# Patient Record
Sex: Female | Born: 2000 | Race: White | Hispanic: No | Marital: Single | Smoking: Former smoker
Health system: Southern US, Community
[De-identification: ages and names within clinical notes are randomized; demographics above are authoritative.]

## PROBLEM LIST (undated history)

## (undated) DIAGNOSIS — F909 Attention-deficit hyperactivity disorder, unspecified type: Secondary | ICD-10-CM

## (undated) DIAGNOSIS — G4700 Insomnia, unspecified: Secondary | ICD-10-CM

## (undated) DIAGNOSIS — N946 Dysmenorrhea, unspecified: Secondary | ICD-10-CM

## (undated) DIAGNOSIS — F419 Anxiety disorder, unspecified: Secondary | ICD-10-CM

## (undated) HISTORY — DX: Anxiety disorder, unspecified: F41.9

## (undated) HISTORY — DX: Dysmenorrhea, unspecified: N94.6

## (undated) HISTORY — DX: Attention-deficit hyperactivity disorder, unspecified type: F90.9

## (undated) HISTORY — DX: Insomnia, unspecified: G47.00

## (undated) HISTORY — PX: WISDOM TOOTH EXTRACTION: SHX21

---
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
## 2019-06-05 DIAGNOSIS — H903 Sensorineural hearing loss, bilateral: Secondary | ICD-10-CM

## 2019-06-05 HISTORY — DX: Sensorineural hearing loss, bilateral: H90.3

## 2019-08-16 DIAGNOSIS — H6121 Impacted cerumen, right ear: Secondary | ICD-10-CM | POA: Insufficient documentation

## 2019-09-26 ENCOUNTER — Ambulatory Visit (INDEPENDENT_AMBULATORY_CARE_PROVIDER_SITE_OTHER): Payer: Self-pay | Admitting: Internal Medicine

## 2019-09-27 ENCOUNTER — Ambulatory Visit (INDEPENDENT_AMBULATORY_CARE_PROVIDER_SITE_OTHER): Payer: Managed Care, Other (non HMO) | Admitting: Internal Medicine

## 2019-09-27 ENCOUNTER — Other Ambulatory Visit: Payer: Self-pay

## 2019-09-27 ENCOUNTER — Encounter (INDEPENDENT_AMBULATORY_CARE_PROVIDER_SITE_OTHER): Payer: Self-pay | Admitting: Internal Medicine

## 2019-09-27 VITALS — BP 90/68 | HR 91 | Temp 98.1°F | Ht 65.0 in | Wt 213.6 lb

## 2019-09-27 DIAGNOSIS — N921 Excessive and frequent menstruation with irregular cycle: Secondary | ICD-10-CM | POA: Diagnosis not present

## 2019-09-27 DIAGNOSIS — Z23 Encounter for immunization: Secondary | ICD-10-CM

## 2019-09-27 DIAGNOSIS — Z131 Encounter for screening for diabetes mellitus: Secondary | ICD-10-CM | POA: Diagnosis not present

## 2019-09-27 DIAGNOSIS — E559 Vitamin D deficiency, unspecified: Secondary | ICD-10-CM | POA: Diagnosis not present

## 2019-09-27 DIAGNOSIS — R5381 Other malaise: Secondary | ICD-10-CM | POA: Diagnosis not present

## 2019-09-27 DIAGNOSIS — Z13220 Encounter for screening for lipoid disorders: Secondary | ICD-10-CM

## 2019-09-27 DIAGNOSIS — R5383 Other fatigue: Secondary | ICD-10-CM

## 2019-09-27 NOTE — Progress Notes (Signed)
Metrics: Intervention Frequency ACO  Documented Smoking Status Yearly  Screened one or more times in 24 months  Cessation Counseling or  Active cessation medication Past 24 months  Past 24 months   Guideline developer: UpToDate (See UpToDate for funding source) Date Released: 2014       Wellness Office Visit  Subjective:  Patient ID: Sharon Ruiz, female    DOB: 10/05/2000  Age: 19 y.o. MRN: [id number]  CC: This is a 19 year old lady who comes to our practice as a new patient to be established.  HPI She feels reasonably well except she has intermittent fatigue and menorrhagia with irregular cycles.  She tells me that her iron levels have been low in the past.  She is not sexually active and has never been so.  She requires influenza and tetanus vaccination as she is about to start working with [facility] health. She appears to have a family history of hypertension and hyperlipidemia.  History reviewed. No pertinent past medical history.    Family History  Problem Relation Age of Onset  . Hypertension Mother   . 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   Social History   Tobacco Use  . Smoking status: Never Smoker  . Smokeless tobacco: Never Used  Substance Use Topics  . Alcohol use: Never    No outpatient medications have been marked as taking for the 09/27/19 encounter (Office Visit) with Wilson Singer, MD.     Objective:   Today's Vitals: BP 90/68 (BP Location: Left Arm, Patient Position: Sitting, Cuff Size: Normal)   Pulse 91   Temp 98.1 F (36.7 C) (Temporal)   Ht 5\' 5"  (1.651 m)   Wt 213 lb 9.6 oz (96.9 kg)   SpO2 98%   BMI 35.54 kg/m  Vitals with BMI 09/27/2019 06/16/2009 12/24/2008  Height 5\' 5"  - 4\' 8"   Weight 213 lbs 10 oz 136 lbs 126 lbs  BMI 35.54 - 28.3  Systolic 90 102 94  Diastolic 68 68 68  Pulse 91 80 80     Physical Exam   She look systemically well.  She is obese.   Blood pressure and is in a very good range.  She is alert and orientated without any obvious focal neurological signs.    Assessment   1. Menorrhagia with irregular cycle   2. Malaise and fatigue   3. Vitamin D deficiency disease   4. Screening for diabetes mellitus   5. Screening for lipoid disorders       Tests ordered Orders Placed This Encounter  Procedures  . CBC  . COMPLETE METABOLIC PANEL WITH GFR  . Hemoglobin A1c  . Lipid panel  . VITAMIN D 25 Hydroxy (Vit-D Deficiency, Fractures)  . T3, free  . T4  . TSH     Plan: 1. Blood work is ordered above. 2. She will be given Tdap vaccination and influenza vaccination. 3. Further recommendations will depend on blood results and I will see her in 3 months time for an annual physical exam. 4. Today I spent 30 minutes with this patient addressing her issues.   No orders of the defined types were placed in this encounter.   02/23/2009, MD

## 2019-09-27 NOTE — Addendum Note (Signed)
Addended by: Jani Gravel A on: 09/27/2019 10:20 AM   Modules accepted: Orders

## 2019-09-28 LAB — COMPLETE METABOLIC PANEL WITHOUT GFR
AG Ratio: 1.4 (calc) (ref 1.0–2.5)
ALT: 25 U/L (ref 6–29)
AST: 27 U/L (ref 10–30)
Albumin: 4 g/dL (ref 3.6–5.1)
Alkaline phosphatase (APISO): 43 U/L (ref 31–125)
BUN: 15 mg/dL (ref 7–25)
CO2: 25 mmol/L (ref 20–32)
Calcium: 8.9 mg/dL (ref 8.6–10.2)
Chloride: 107 mmol/L (ref 98–110)
Creat: 0.66 mg/dL (ref 0.50–1.10)
GFR, Est African American: 146 mL/min/1.73m2
GFR, Est Non African American: 126 mL/min/1.73m2
Globulin: 2.8 g/dL (ref 1.9–3.7)
Glucose, Bld: 86 mg/dL (ref 65–99)
Potassium: 4.4 mmol/L (ref 3.5–5.3)
Sodium: 140 mmol/L (ref 135–146)
Total Bilirubin: 0.5 mg/dL (ref 0.2–1.2)
Total Protein: 6.8 g/dL (ref 6.1–8.1)

## 2019-09-28 LAB — CBC
HCT: 34.6 % — ABNORMAL LOW (ref 35.0–45.0)
Hemoglobin: 11.3 g/dL — ABNORMAL LOW (ref 11.7–15.5)
MCH: 28.1 pg (ref 27.0–33.0)
MCHC: 32.7 g/dL (ref 32.0–36.0)
MCV: 86.1 fL (ref 80.0–100.0)
MPV: 11.8 fL (ref 7.5–12.5)
Platelets: 277 Thousand/uL (ref 140–400)
RBC: 4.02 Million/uL (ref 3.80–5.10)
RDW: 13.4 % (ref 11.0–15.0)
WBC: 5.6 Thousand/uL (ref 3.8–10.8)

## 2019-09-28 LAB — HEMOGLOBIN A1C
Hgb A1c MFr Bld: 5.3 %{Hb}
Mean Plasma Glucose: 105 (calc)
eAG (mmol/L): 5.8 (calc)

## 2019-09-28 LAB — VITAMIN D 25 HYDROXY (VIT D DEFICIENCY, FRACTURES): Vit D, 25-Hydroxy: 17 ng/mL — ABNORMAL LOW (ref 30–100)

## 2019-09-28 LAB — LIPID PANEL
Cholesterol: 170 mg/dL (ref <200)
HDL: 65 mg/dL (ref >=50)
LDL Cholesterol (Calc): 96 mg/dL (calc)
Non-HDL Cholesterol (Calc): 105 mg/dL (calc) (ref <130)
Total CHOL/HDL Ratio: 2.6 (calc) (ref <5.0)
Triglycerides: 32 mg/dL (ref <150)

## 2019-09-28 LAB — T3, FREE: T3, Free: 2.9 pg/mL (ref 2.3–4.2)

## 2019-09-28 LAB — T4: T4, Total: 6.8 ug/dL (ref 5.1–11.9)

## 2019-09-28 LAB — TSH: TSH: 0.56 m[IU]/L

## 2019-10-01 ENCOUNTER — Other Ambulatory Visit (INDEPENDENT_AMBULATORY_CARE_PROVIDER_SITE_OTHER): Payer: Self-pay | Admitting: Internal Medicine

## 2019-10-01 ENCOUNTER — Encounter (INDEPENDENT_AMBULATORY_CARE_PROVIDER_SITE_OTHER): Payer: Self-pay | Admitting: Internal Medicine

## 2019-10-01 NOTE — Telephone Encounter (Signed)
Pls advise.  

## 2019-11-16 ENCOUNTER — Encounter (INDEPENDENT_AMBULATORY_CARE_PROVIDER_SITE_OTHER): Payer: Self-pay | Admitting: Internal Medicine

## 2019-11-22 ENCOUNTER — Ambulatory Visit (INDEPENDENT_AMBULATORY_CARE_PROVIDER_SITE_OTHER): Payer: Managed Care, Other (non HMO) | Admitting: Nurse Practitioner

## 2019-12-04 ENCOUNTER — Encounter (INDEPENDENT_AMBULATORY_CARE_PROVIDER_SITE_OTHER): Payer: Self-pay

## 2019-12-05 ENCOUNTER — Ambulatory Visit (INDEPENDENT_AMBULATORY_CARE_PROVIDER_SITE_OTHER): Payer: Managed Care, Other (non HMO) | Admitting: Nurse Practitioner

## 2020-01-03 ENCOUNTER — Encounter (INDEPENDENT_AMBULATORY_CARE_PROVIDER_SITE_OTHER): Payer: Managed Care, Other (non HMO) | Admitting: Nurse Practitioner

## 2020-01-10 ENCOUNTER — Encounter (INDEPENDENT_AMBULATORY_CARE_PROVIDER_SITE_OTHER): Payer: Managed Care, Other (non HMO) | Admitting: Nurse Practitioner

## 2020-01-16 ENCOUNTER — Encounter (INDEPENDENT_AMBULATORY_CARE_PROVIDER_SITE_OTHER): Payer: Self-pay | Admitting: Nurse Practitioner

## 2020-01-16 ENCOUNTER — Ambulatory Visit (INDEPENDENT_AMBULATORY_CARE_PROVIDER_SITE_OTHER): Payer: Managed Care, Other (non HMO) | Admitting: Nurse Practitioner

## 2020-01-16 ENCOUNTER — Other Ambulatory Visit: Payer: Self-pay

## 2020-01-16 VITALS — BP 95/65 | HR 91 | Temp 96.8°F | Ht 65.0 in | Wt 205.0 lb

## 2020-01-16 DIAGNOSIS — R42 Dizziness and giddiness: Secondary | ICD-10-CM | POA: Diagnosis not present

## 2020-01-16 DIAGNOSIS — Z0001 Encounter for general adult medical examination with abnormal findings: Secondary | ICD-10-CM

## 2020-01-16 DIAGNOSIS — D649 Anemia, unspecified: Secondary | ICD-10-CM

## 2020-01-16 DIAGNOSIS — K5900 Constipation, unspecified: Secondary | ICD-10-CM

## 2020-01-16 DIAGNOSIS — E559 Vitamin D deficiency, unspecified: Secondary | ICD-10-CM

## 2020-01-16 DIAGNOSIS — F321 Major depressive disorder, single episode, moderate: Secondary | ICD-10-CM | POA: Diagnosis not present

## 2020-01-16 HISTORY — DX: Vitamin D deficiency, unspecified: E55.9

## 2020-01-16 HISTORY — DX: Anemia, unspecified: D64.9

## 2020-01-16 NOTE — Patient Instructions (Signed)
Senna-S --take 1 to 2 tablets up to 2 times a day as needed for constipation.  If you experience significant abdominal pain, do not continue to take this medication and call this office so we can perform further evaluation.  If the abdominal pain is severe then you need to proceed to the emergency department for further evaluation.

## 2020-01-16 NOTE — Progress Notes (Signed)
Subjective:  Patient ID: Sharon Ruiz, female    DOB: Apr 09, 2001  Age: 19 y.o. MRN: [id number]  CC:  Chief Complaint  Patient presents with  . Annual Exam  . Constipation      HPI  This patient was today for annual physical exam.  Only acute complaint is that she has been experiencing some constipation.  She tells me she took a stool softener over-the-counter and she has had a bowel movement since taking the medicine.  She is having some mild abdominal discomfort.  She tells me she does hydrate regularly throughout the day.  All of her labs were collected prior to this appointment and most things were within normal limits except she did have some mild anemia, she has been started on iron supplements since this blood work was collected.  Her vitamin D level was quite low and she has taken some vitamin D3 supplement, but has not been taking it regularly.  She tells me she plans on taking it regularly.  As far as health maintenance she is up-to-date with all recommended vaccines except for the COVID-19 series.  She would not like to undergo a second transmit infection screening, Pap smear, or hepatitis C screen at this time.  She is due for depression screening and to discuss folic acid supplement.  She is a non-smoker.   No past medical history on file.    Family History  Problem Relation Age of Onset  . Hypertension Mother   . 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   Social History   Tobacco Use  . Smoking status: Never Smoker  . Smokeless tobacco: Never Used  Substance Use Topics  . Alcohol use: Never     Current Meds  Medication Sig  . Cholecalciferol 1.25 MG (50000 UT) TABS Take 5,000 Int'l Units by mouth daily.  . ferrous sulfate 324 (65 Fe) MG TBEC Take 1 tablet by mouth daily.    ROS:  Review of Systems  Eyes: Negative.   Respiratory: Negative.   Cardiovascular: Negative.     Gastrointestinal: Positive for constipation. Negative for blood in stool.  Musculoskeletal: Negative.   Neurological: Positive for dizziness.  Psychiatric/Behavioral: Positive for depression. Negative for suicidal ideas.     Objective:   Today's Vitals: BP 95/65 (BP Location: Left Arm, Patient Position: Sitting, Cuff Size: Normal)   Pulse 91   Temp (!) 96.8 F (36 C) (Temporal)   Ht '5\' 5"'$  (1.651 m)   Wt 205 lb (93 kg)   SpO2 99%   BMI 34.11 kg/m  Vitals with BMI 01/16/2020 09/27/2019 06/16/2009  Height '5\' 5"'$  '5\' 5"'$  -  Weight 205 lbs 213 lbs 10 oz 136 lbs  BMI 01.74 94.49 -  Systolic 95 90 675  Diastolic 65 68 68  Pulse 91 91 80     Physical Exam Vitals reviewed. Exam conducted with a chaperone present.  Constitutional:      Appearance: Normal appearance.  HENT:     Head: Normocephalic and atraumatic.     Right Ear: Ear canal and external ear normal.     Left Ear: Ear canal and external ear normal.  Eyes:     General:        Right eye: No discharge.        Left eye: No discharge.     Extraocular Movements: Extraocular movements intact.     Conjunctiva/sclera: Conjunctivae  normal.     Pupils: Pupils are equal, round, and reactive to light.  Neck:     Vascular: No carotid bruit.  Cardiovascular:     Rate and Rhythm: Normal rate and regular rhythm.     Pulses: Normal pulses.     Heart sounds: Normal heart sounds. No murmur heard.   Pulmonary:     Effort: Pulmonary effort is normal.     Breath sounds: Normal breath sounds.  Chest:     Breasts: Breasts are symmetrical.        Right: Normal.        Left: Normal.  Abdominal:     General: Abdomen is flat. Bowel sounds are normal. There is no distension.     Palpations: Abdomen is soft. There is no mass.     Tenderness: There is no abdominal tenderness.  Musculoskeletal:        General: No tenderness.     Cervical back: Neck supple. No muscular tenderness.     Right lower leg: No edema.     Left lower leg: No  edema.  Lymphadenopathy:     Cervical: No cervical adenopathy.     Upper Body:     Right upper body: No supraclavicular adenopathy.     Left upper body: No supraclavicular adenopathy.  Skin:    General: Skin is warm and dry.  Neurological:     General: No focal deficit present.     Mental Status: She is alert and oriented to person, place, and time.     Motor: No weakness.     Gait: Gait normal.  Psychiatric:        Mood and Affect: Mood normal.        Behavior: Behavior normal.        Judgment: Judgment normal.          Assessment and Plan   1. Encounter for general adult medical examination with abnormal findings   2. Current moderate episode of major depressive disorder, unspecified whether recurrent ([place])   3. Vitamin D deficiency   4. Anemia, unspecified type   5. Constipation, unspecified constipation type   6. Dizziness      Plan: 1., 2. I encouraged her to consider getting the COVID-19 vaccine series, I asked that she let us know she does this we can update her chart.  As stated above we will not do sexual transmitted infection screening, sent for Pap smear, or screen for hepatitis C per patient preference.  We did discuss folic acid supplementation if she were to become sexually active for prevention of neural tube defects in pregnancy.  Depression screening did show positive for moderate depression.  We did discuss options regarding treatment.  For now she would like to hold off on any pharmacological treatment, she may consider trying to go to counseling.  She is a non-smoker so she does not require tobacco cessation conversation.  3.  I encouraged her to continue taking her vitamin D3 supplement regularly.  She tells me she will try to do this.  She will have blood work drawn approximately 1 to 2 weeks prior to her next upcoming appointment to evaluate her serum level.  4.  We will collect repeat CBC prior to her next appointment for further evaluation.  5.  I  encouraged her to use her stool softener as well as to maintain hydration regularly.  We did discuss if her use of over-the-counter stool softener does not result in bowel movement and/or  she experiences significant abdominal pain she should call this office, or proceed to the emergency department if her abdominal pain is severe.  She tells me she understands.  6.  She did mention some vague dizziness during her review of systems.  She tells me the dizziness does not occur very regularly, does not seem quite severe.  I did tell her if the dizziness occurs more frequently or last for a longer period time that she should notify us so we can consider further work-up.  She tells me she understands.  Tests ordered Orders Placed This Encounter  Procedures  . CMP with eGFR([facility])  . CBC  . Ferritin  . Iron and TIBC  . Vitamin D, 25-hydroxy      No orders of the defined types were placed in this encounter.   Patient to follow-up in 3 months or sooner as needed.  In addition to doing her annual physical exam today, I did conduct an office visit to address her concerns.  Ailene Ards, NP

## 2020-04-07 ENCOUNTER — Other Ambulatory Visit (INDEPENDENT_AMBULATORY_CARE_PROVIDER_SITE_OTHER): Payer: Managed Care, Other (non HMO)

## 2020-04-21 ENCOUNTER — Ambulatory Visit (INDEPENDENT_AMBULATORY_CARE_PROVIDER_SITE_OTHER): Payer: Managed Care, Other (non HMO) | Admitting: Nurse Practitioner

## 2020-04-22 ENCOUNTER — Encounter (INDEPENDENT_AMBULATORY_CARE_PROVIDER_SITE_OTHER): Payer: Self-pay | Admitting: Nurse Practitioner

## 2020-04-22 ENCOUNTER — Other Ambulatory Visit: Payer: Self-pay

## 2020-04-22 ENCOUNTER — Ambulatory Visit (INDEPENDENT_AMBULATORY_CARE_PROVIDER_SITE_OTHER): Payer: Managed Care, Other (non HMO) | Admitting: Nurse Practitioner

## 2020-04-22 VITALS — BP 122/80 | HR 82 | Temp 97.5°F | Resp 12 | Ht 65.0 in | Wt 215.0 lb

## 2020-04-22 DIAGNOSIS — R41840 Attention and concentration deficit: Secondary | ICD-10-CM | POA: Diagnosis not present

## 2020-04-22 DIAGNOSIS — Z0001 Encounter for general adult medical examination with abnormal findings: Secondary | ICD-10-CM | POA: Diagnosis not present

## 2020-04-22 DIAGNOSIS — E559 Vitamin D deficiency, unspecified: Secondary | ICD-10-CM | POA: Diagnosis not present

## 2020-04-22 DIAGNOSIS — F321 Major depressive disorder, single episode, moderate: Secondary | ICD-10-CM | POA: Diagnosis not present

## 2020-04-22 DIAGNOSIS — D649 Anemia, unspecified: Secondary | ICD-10-CM

## 2020-04-22 NOTE — Progress Notes (Signed)
Subjective:  Patient ID: Sharon Ruiz, female    DOB: 2000/08/23  Age: 19 y.o. MRN: [id number]  CC:  Chief Complaint  Patient presents with  . Anemia  . Other    Vitamin D deficiency, concerns with attention      HPI  This patient arrives today for the above.  She has a history of anemia and continues on iron supplement.  She is due to have blood work rechecked today.  She continues on her vitamin D3 supplement.  Last serum check showed vitamin D level of 17.  She is due to have serum check today.  She tells me she is concerned that she may have adult attention deficit disorder.  She has been looking up symptoms of this online and feels that she has many of the symptoms listed.  She would like to be evaluated for this.  History reviewed. No pertinent past medical history.    Family History  Problem Relation Age of Onset  . Hypertension Mother   . 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   Social History   Tobacco Use  . Smoking status: Never Smoker  . Smokeless tobacco: Never Used  Substance Use Topics  . Alcohol use: Never     No outpatient medications have been marked as taking for the 04/22/20 encounter (Office Visit) with Elenore Paddy, NP.    ROS:  Review of Systems  Constitutional: Positive for malaise/fatigue. Negative for fever.  Respiratory: Negative.   Cardiovascular: Negative.   Gastrointestinal: Negative.   Musculoskeletal: Negative.   Neurological: Negative.      Objective:   Today's Vitals: BP 122/80   Pulse 82   Temp (!) 97.5 F (36.4 C)   Resp 12   Ht 5\' 5"  (1.651 m)   Wt 215 lb (97.5 kg)   LMP 04/11/2020   SpO2 97%   BMI 35.78 kg/m  Vitals with BMI 04/22/2020 01/16/2020 09/27/2019  Height 5\' 5"  5\' 5"  5\' 5"   Weight 215 lbs 205 lbs 213 lbs 10 oz  BMI 35.78 34.11 35.54  Systolic 122 95 90  Diastolic 80 65 68  Pulse 82 91 91     Physical Exam Vitals  reviewed.  Constitutional:      General: She is not in acute distress.    Appearance: Normal appearance.  HENT:     Head: Normocephalic and atraumatic.  Neck:     Vascular: No carotid bruit.  Cardiovascular:     Rate and Rhythm: Normal rate and regular rhythm.     Pulses: Normal pulses.     Heart sounds: Normal heart sounds.  Pulmonary:     Effort: Pulmonary effort is normal.     Breath sounds: Normal breath sounds.  Skin:    General: Skin is warm and dry.  Neurological:     General: No focal deficit present.     Mental Status: She is alert and oriented to person, place, and time.  Psychiatric:        Mood and Affect: Mood normal.        Behavior: Behavior normal.        Judgment: Judgment normal.          Assessment and Plan   1. Attention and concentration deficit   2. Vitamin D deficiency   3. Anemia, unspecified type      Plan: 1.  Referral to psychiatry placed  that she can have formal evaluation for possible attention deficit disorder.  She was told to call the office if she does not hear back from psychiatry to schedule this point within the next couple weeks. 2, 3.  We will collect serum vitamin D3, CMP, CBC, iron, and ferritin level for further evaluation today.   Tests ordered Orders Placed This Encounter  Procedures  . Ambulatory referral to Psychiatry      No orders of the defined types were placed in this encounter.   Patient to follow-up in 6 months or sooner as needed.  Elenore Paddy, NP

## 2020-04-22 NOTE — Addendum Note (Signed)
Addended by: Elenore Paddy on: 04/22/2020 08:16 AM   Modules accepted: Orders

## 2020-04-23 LAB — CBC
HCT: 38 % (ref 35.0–45.0)
Hemoglobin: 12.3 g/dL (ref 11.7–15.5)
MCH: 29.8 pg (ref 27.0–33.0)
MCHC: 32.4 g/dL (ref 32.0–36.0)
MCV: 92 fL (ref 80.0–100.0)
MPV: 12.3 fL (ref 7.5–12.5)
Platelets: 267 10*3/uL (ref 140–400)
RBC: 4.13 10*6/uL (ref 3.80–5.10)
RDW: 12.9 % (ref 11.0–15.0)
WBC: 8 10*3/uL (ref 3.8–10.8)

## 2020-04-23 LAB — TRANSFERRIN: Transferrin: 342 mg/dL — ABNORMAL HIGH (ref 188–341)

## 2020-04-23 LAB — COMPLETE METABOLIC PANEL WITH GFR
AG Ratio: 1.4 (calc) (ref 1.0–2.5)
ALT: 12 U/L (ref 6–29)
AST: 13 U/L (ref 10–30)
Albumin: 4.3 g/dL (ref 3.6–5.1)
Alkaline phosphatase (APISO): 46 U/L (ref 31–125)
BUN: 13 mg/dL (ref 7–25)
CO2: 24 mmol/L (ref 20–32)
Calcium: 9.3 mg/dL (ref 8.6–10.2)
Chloride: 103 mmol/L (ref 98–110)
Creat: 0.71 mg/dL (ref 0.50–1.10)
GFR, Est African American: 141 mL/min/{1.73_m2} (ref >=60)
GFR, Est Non African American: 122 mL/min/{1.73_m2} (ref >=60)
Globulin: 3 g/dL (calc) (ref 1.9–3.7)
Glucose, Bld: 82 mg/dL (ref 65–99)
Potassium: 4.1 mmol/L (ref 3.5–5.3)
Sodium: 136 mmol/L (ref 135–146)
Total Bilirubin: 0.3 mg/dL (ref 0.2–1.2)
Total Protein: 7.3 g/dL (ref 6.1–8.1)

## 2020-04-23 LAB — FERRITIN: Ferritin: 7 ng/mL — ABNORMAL LOW (ref 16–154)

## 2020-04-23 LAB — IRON: Iron: 305 ug/dL — ABNORMAL HIGH (ref 40–190)

## 2020-04-23 LAB — VITAMIN D 25 HYDROXY (VIT D DEFICIENCY, FRACTURES): Vit D, 25-Hydroxy: 27 ng/mL — ABNORMAL LOW (ref 30–100)

## 2020-05-07 ENCOUNTER — Encounter (INDEPENDENT_AMBULATORY_CARE_PROVIDER_SITE_OTHER): Payer: Self-pay | Admitting: Nurse Practitioner

## 2020-05-07 ENCOUNTER — Other Ambulatory Visit (INDEPENDENT_AMBULATORY_CARE_PROVIDER_SITE_OTHER): Payer: Self-pay | Admitting: Nurse Practitioner

## 2020-05-07 DIAGNOSIS — R41840 Attention and concentration deficit: Secondary | ICD-10-CM

## 2020-06-27 DIAGNOSIS — M533 Sacrococcygeal disorders, not elsewhere classified: Secondary | ICD-10-CM | POA: Diagnosis not present

## 2020-07-10 ENCOUNTER — Encounter (INDEPENDENT_AMBULATORY_CARE_PROVIDER_SITE_OTHER): Payer: Self-pay | Admitting: Nurse Practitioner

## 2020-07-11 DIAGNOSIS — M545 Low back pain, unspecified: Secondary | ICD-10-CM | POA: Diagnosis not present

## 2020-07-22 DIAGNOSIS — M545 Low back pain, unspecified: Secondary | ICD-10-CM | POA: Diagnosis not present

## 2020-07-29 DIAGNOSIS — M545 Low back pain, unspecified: Secondary | ICD-10-CM | POA: Diagnosis not present

## 2020-08-05 DIAGNOSIS — M545 Low back pain, unspecified: Secondary | ICD-10-CM | POA: Diagnosis not present

## 2020-08-15 DIAGNOSIS — M545 Low back pain, unspecified: Secondary | ICD-10-CM | POA: Diagnosis not present

## 2020-08-19 ENCOUNTER — Encounter: Payer: Self-pay | Admitting: Adult Health

## 2020-08-19 ENCOUNTER — Other Ambulatory Visit: Payer: Self-pay

## 2020-08-19 ENCOUNTER — Ambulatory Visit (INDEPENDENT_AMBULATORY_CARE_PROVIDER_SITE_OTHER): Payer: 59 | Admitting: Adult Health

## 2020-08-19 VITALS — BP 130/88 | HR 95 | Ht 65.0 in | Wt 205.0 lb

## 2020-08-19 DIAGNOSIS — F410 Panic disorder [episodic paroxysmal anxiety] without agoraphobia: Secondary | ICD-10-CM | POA: Diagnosis not present

## 2020-08-19 DIAGNOSIS — F411 Generalized anxiety disorder: Secondary | ICD-10-CM

## 2020-08-19 DIAGNOSIS — F331 Major depressive disorder, recurrent, moderate: Secondary | ICD-10-CM

## 2020-08-19 DIAGNOSIS — F909 Attention-deficit hyperactivity disorder, unspecified type: Secondary | ICD-10-CM

## 2020-08-19 MED ORDER — SERTRALINE HCL 50 MG PO TABS: 50.0000 mg | ORAL_TABLET | Freq: Every day | ORAL | 2 refills | Status: DC

## 2020-08-19 NOTE — Progress Notes (Signed)
[facility] MD/PA/NP Initial Note  08/19/2020 9:07 AM Sharon Ruiz  MRN:  [id number]  Chief Complaint:   HPI:   Referred by PCP.  Describes mood today as "ok". Pleasant. Denies tearfulness. Mood symptoms - denies current depression - had a recent episode of about a month or 2 and then got better, anxiety "definitely" occurs pretty often, and irritability at times. Reports panic attacks. Stating "I feel like my mood is better than it was". Is open to trying medication for mood stabilization as well as ADHD. Improved interest and motivation. Taking medications as prescribed.  Energy levels stable. Active, has a regular exercise routine.  Enjoys some usual interests and activities. Lives alone. Family local. Spending time with family. Appetite adequate - eating one meal a day. Weight stable - 205 pounds. Sleeps well most nights. Averages 8 to 9 hours. Works 3rd shift at [employer].  Focus and concentration difficulties.  Completing tasks. Managing aspects of household. Works full-time. Denies SI or HI.  Denies AH or VH. Has seen a therapist - last appointment 04 July 2020.   Previous medication trials: Denies  Visit Diagnosis:    ICD-10-CM   1. Generalized anxiety disorder  F41.1 sertraline (ZOLOFT) 50 MG tablet  2. Major depressive disorder, recurrent episode, moderate (HCC)  F33.1 sertraline (ZOLOFT) 50 MG tablet  3. Attention deficit hyperactivity disorder (ADHD), unspecified ADHD type  F90.9   4. Panic attacks  F41.0     Past Psychiatric History: Denies psychiatric hospitalization.  Past Medical History: No past medical history on file.  Past Surgical History:  Procedure Laterality Date  . CHOLECYSTECTOMY    . WISDOM TOOTH EXTRACTION      Family Psychiatric History: Family members with ADHD.   Family History:  Family History  Problem Relation Age of Onset  . Hypertension Mother   . Hyperlipidemia Father     Social History:  Social History   Socioeconomic  History  . Marital status: Single    Spouse name: Not on file  . Number of children: Not on file  . Years of education: Not on file  . Highest education level: Not on file  Occupational History  . Occupation: [employer]  Tobacco Use  . Smoking status: Never Smoker  . Smokeless tobacco: Never Used  Substance and Sexual Activity  . Alcohol use: Never  . Drug use: Never  . Sexual activity: Not Currently  Other Topics Concern  . 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   Social Determinants of Health   Financial Resource Strain: Not on file  Food Insecurity: Not on file  Transportation Needs: Not on file  Physical Activity: Not on file  Stress: Not on file  Social Connections: Not on file    Allergies:  Allergies  Allergen Reactions  . Iodinated Diagnostic Agents Other (See Comments)    SHELL FISH; Mouth blisters and stomach pain Mouth blisters and stomach pain     Metabolic Disorder Labs: Lab Results  Component Value Date   HGBA1C 5.3 09/27/2019   MPG 105 09/27/2019   No results found for: PROLACTIN Lab Results  Component Value Date   CHOL 170 09/27/2019   TRIG 32 09/27/2019   HDL 65 09/27/2019   CHOLHDL 2.6 09/27/2019   LDLCALC 96 09/27/2019   Lab Results  Component Value Date   TSH 0.56 09/27/2019    Therapeutic Level Labs: No results found for: LITHIUM No results found for: VALPROATE No components  found for:  CBMZ  Current Medications: Current Outpatient Medications  Medication Sig Dispense Refill  . sertraline (ZOLOFT) 50 MG tablet Take 1 tablet (50 mg total) by mouth daily. 30 tablet 2  . Cholecalciferol 1.25 MG (50000 UT) TABS Take 5,000 Int'l Units by mouth daily.    . ferrous sulfate 324 (65 Fe) MG TBEC Take 1 tablet by mouth daily.     No current facility-administered medications for this visit.    Medication Side Effects: none  Orders placed this visit:  No orders of the defined types were placed  in this encounter.   Psychiatric Specialty Exam:  Review of Systems  Blood pressure 130/88, pulse 95, height 5\' 5"  (1.651 m), weight 205 lb (93 kg).Body mass index is 34.11 kg/m.  General Appearance: Neat and Well Groomed  Eye Contact:  Good  Speech:  Clear and Coherent and Normal Rate  Volume:  Normal  Mood:  Anxious, Depressed and Irritable  Affect:  Appropriate and Congruent  Thought Process:  Coherent and Descriptions of Associations: Intact  Orientation:  Full (Time, Place, and Person)  Thought Content: Logical   Suicidal Thoughts:  No  Homicidal Thoughts:  No  Memory:  WNL  Judgement:  Good  Insight:  Good  Psychomotor Activity:  Normal  Concentration:  Concentration: Difficulties.  Recall:  of Knowledge: Good  Language: Good  Assets:  Communication Skills Desire for Improvement Financial Resources/Insurance Housing Intimacy Leisure Time Physical Health Resilience Social Support Talents/Skills Transportation Vocational/Educational  ADL's:  Intact  Cognition: WNL  Prognosis:  Good   Screenings:  PHQ2-9   Flowsheet Row Office Visit from 01/16/2020 in [facility] Office Visit from 09/27/2019 in [facility]  PHQ-2 Total Score 2 0  PHQ-9 Total Score 11 --      Receiving Psychotherapy: Yes   Treatment Plan/Recommendations:   Plan:  PDMP reviewed  1. Zoloft 50mg  daily - 1/2 tab daily x 7, then one tab daily.  Will complete ADHD testing before next visit.   RTC 4 weeks  Patient advised to contact office with any questions, adverse effects, or acute worsening in signs and symptoms.  [place], NP

## 2020-08-30 ENCOUNTER — Ambulatory Visit (HOSPITAL_COMMUNITY)
Admission: EM | Admit: 2020-08-30 | Discharge: 2020-08-30 | Disposition: A | Discharge status: Home / Self Care | Payer: 59 | Attending: Student | Admitting: Student

## 2020-08-30 ENCOUNTER — Other Ambulatory Visit: Payer: Self-pay

## 2020-08-30 ENCOUNTER — Encounter (HOSPITAL_COMMUNITY): Payer: Self-pay | Admitting: Emergency Medicine

## 2020-08-30 DIAGNOSIS — J029 Acute pharyngitis, unspecified: Secondary | ICD-10-CM | POA: Diagnosis not present

## 2020-08-30 DIAGNOSIS — Z112 Encounter for screening for other bacterial diseases: Secondary | ICD-10-CM | POA: Diagnosis not present

## 2020-08-30 LAB — POCT RAPID STREP A, ED / UC: Streptococcus, Group A Screen (Direct): NEGATIVE

## 2020-08-30 MED ORDER — LIDOCAINE VISCOUS HCL 2 % MT SOLN: 15.0000 mL | OROMUCOSAL | 0 refills | Status: DC | PRN

## 2020-08-30 NOTE — ED Triage Notes (Signed)
Patient c/o sore throat x 1 day.   Patient denies fever, cough, or SOB.   Patient endorses difficulty swallowing.   Patient took an "allergy medication and ibuprofen" with no relief of symptoms.

## 2020-08-30 NOTE — Discharge Instructions (Signed)
-  For sore throat, use lidocaine mouthwash up to every 4 hours. Make sure not to eat for at least 1 hour after using this, as your mouth will be very numb and you could bite yourself. -For fevers/chills, body aches, headaches- use Tylenol and Ibuprofen. You can alternate these for maximum effect. Use up to 3000mg  Tylenol daily and 3200mg  Ibuprofen daily. Make sure to take ibuprofen with food. Check the bottle of ibuprofen/tylenol for specific dosage instructions. -Make sure to have your covid test done tonight at work.

## 2020-08-30 NOTE — ED Provider Notes (Signed)
[facility]    CSN: [id number] Arrival date & time: 08/30/20  1047      History   Chief Complaint Chief Complaint  Patient presents with  . Sore Throat    HPI Sharon Ruiz is a 20 y.o. female presenting for sore throat.  History anemia impacted cerumen sensorineural hearing loss eczema patellofemoral syndrome vertigo.  Presenting today with sore throat x1 day.  Denies other URI symptoms denies fevers cough shortness of breath difficulty swallowing.  No known sick exposures.  Denying Covid test today as she will be tested at work tonight. Has tried OTC cold medication without relief.  HPI  History reviewed. No pertinent past medical history.  Patient Active Problem List   Diagnosis Date Noted  . Vitamin D deficiency 01/16/2020  . Current moderate episode of major depressive disorder (HCC) 01/16/2020  . Anemia 01/16/2020  . Impacted cerumen of right ear 08/16/2019  . Sensorineural hearing loss (SNHL), bilateral 06/05/2019  . ECZEMA 06/16/2009  . 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 VERTIGO 12/24/2008    Past Surgical History:  Procedure Laterality Date  . CHOLECYSTECTOMY    . WISDOM TOOTH EXTRACTION      OB History   No obstetric history on file.      Home Medications    Prior to Admission medications   Medication Sig Start Date End Date Taking? Authorizing Provider  lidocaine (XYLOCAINE) 2 % solution Use as directed 15 mLs in the mouth or throat as needed for mouth pain. 08/30/20  Yes Rhys Martini, PA-C  sertraline (ZOLOFT) 50 MG tablet Take 1 tablet (50 mg total) by mouth daily. 08/19/20  Yes Mozingo, Thereasa Solo, NP  Cholecalciferol 1.25 MG (50000 UT) TABS Take 5,000 Int'l Units by mouth daily.    [provider]  ferrous sulfate 324 (65 Fe) MG TBEC Take 1 tablet by mouth daily.    [provider]    Family History Family History  Problem Relation Age of Onset  . Hypertension Mother    . Hyperlipidemia Father     Social History Social History   Tobacco Use  . Smoking status: Never Smoker  . Smokeless tobacco: Never Used  Substance Use Topics  . Alcohol use: Never  . Drug use: Never     Allergies   Iodinated diagnostic agents   Review of Systems Review of Systems  Constitutional: Negative for appetite change, chills and fever.  HENT: Positive for sore throat. Negative for congestion, ear pain, rhinorrhea, sinus pressure and sinus pain.   Eyes: Negative for redness and visual disturbance.  Respiratory: Negative for cough, chest tightness, shortness of breath and wheezing.   Cardiovascular: Negative for chest pain and palpitations.  Gastrointestinal: Negative for abdominal pain, constipation, diarrhea, nausea and vomiting.  Genitourinary: Negative for dysuria, frequency and urgency.  Musculoskeletal: Negative for myalgias.  Neurological: Negative for dizziness, weakness and headaches.  Psychiatric/Behavioral: Negative for confusion.  All other systems reviewed and are negative.    Physical Exam Triage Vital Signs ED Triage Vitals  Enc Vitals Group     BP 08/30/20 1151 110/68     Pulse Rate 08/30/20 1151 62     Resp 08/30/20 1151 16     Temp 08/30/20 1151 98.2 F (36.8 C)     Temp Source 08/30/20 1151 Oral     SpO2 08/30/20 1151 100 %     Weight 08/30/20 1149 200 lb (90.7 kg)     Height 08/30/20 1149  5\' 5"  (1.651 m)     Head Circumference --      Peak Flow --      Pain Score 08/30/20 1149 4     Pain Loc --      Pain Edu? --      Excl. in GC? --    No data found.  Updated Vital Signs BP 110/68 (BP Location: Left Arm)   Pulse 62   Temp 98.2 F (36.8 C) (Oral)   Resp 16   Ht 5\' 5"  (1.651 m)   Wt 200 lb (90.7 kg)   LMP 08/27/2020   SpO2 100%   BMI 33.28 kg/m   Visual Acuity Right Eye Distance:   Left Eye Distance:   Bilateral Distance:    Right Eye Near:   Left Eye Near:    Bilateral Near:     Physical Exam Vitals reviewed.   Constitutional:      General: She is not in acute distress.    Appearance: Normal appearance. She is not ill-appearing.  HENT:     Head: Normocephalic and atraumatic.     Right Ear: Tympanic membrane, ear canal and external ear normal. No swelling or tenderness. There is no impacted cerumen. No mastoid tenderness. Tympanic membrane is not perforated, erythematous, retracted or bulging.     Left Ear: Tympanic membrane, ear canal and external ear normal. No swelling or tenderness. There is no impacted cerumen. No mastoid tenderness. Tympanic membrane is not perforated, erythematous, retracted or bulging.     Ears:     Comments: Wearing hearing aids    Nose:     Right Sinus: No maxillary sinus tenderness or frontal sinus tenderness.     Left Sinus: No maxillary sinus tenderness or frontal sinus tenderness.     Mouth/Throat:     Mouth: Mucous membranes are moist.     Pharynx: Uvula midline. Posterior oropharyngeal erythema present. No oropharyngeal exudate.     Tonsils: No tonsillar exudate.     Comments: Smooth erythema posterior pharynx. Cardiovascular:     Rate and Rhythm: Normal rate and regular rhythm.     Heart sounds: Normal heart sounds.  Pulmonary:     Breath sounds: Normal breath sounds and air entry. No wheezing, rhonchi or rales.  Chest:     Chest wall: No tenderness.  Abdominal:     General: Abdomen is flat. Bowel sounds are normal.     Tenderness: There is no abdominal tenderness. There is no guarding or rebound.  Lymphadenopathy:     Cervical: No cervical adenopathy.  Neurological:     General: No focal deficit present.     Mental Status: She is alert and oriented to person, place, and time.  Psychiatric:        Attention and Perception: Attention and perception normal.        Mood and Affect: Mood and affect normal.        Behavior: Behavior normal. Behavior is cooperative.        Thought Content: Thought content normal.        Judgment: Judgment normal.       UC Treatments / Results  Labs (all labs ordered are listed, but only abnormal results are displayed) Labs Reviewed  CULTURE, GROUP A STREP [facility])  POCT RAPID STREP A, ED / UC    EKG   Radiology No results found.  Procedures Procedures (including critical care time)  Medications Ordered in UC Medications - No data to display  Initial Impression /  Assessment and Plan / UC Course  I have reviewed the triage vital signs and the nursing notes.  Pertinent labs & imaging results that were available during my care of the patient were reviewed by me and considered in my medical decision making (see chart for details).     Rapid strep negative. Culture sent.  Patient denies covid test stating she will be tested tonight before work.   Lidocaine mouthwash for symptomatic relief. Continue OTC medications as needed.   Spent over 40 minutes obtaining H&P, performing physical, discussing results, treatment plan and plan for follow-up with patient. Patient agrees with plan.  This chart was dictated using voice recognition software, Dragon. Despite the best efforts of this provider to proofread and correct errors, errors may still occur which can change documentation meaning.    Final Clinical Impressions(s) / UC Diagnoses   Final diagnoses:  Viral pharyngitis  Screening for streptococcal infection     Discharge Instructions     -For sore throat, use lidocaine mouthwash up to every 4 hours. Make sure not to eat for at least 1 hour after using this, as your mouth will be very numb and you could bite yourself. -For fevers/chills, body aches, headaches- use Tylenol and Ibuprofen. You can alternate these for maximum effect. Use up to 3000mg  Tylenol daily and 3200mg  Ibuprofen daily. Make sure to take ibuprofen with food. Check the bottle of ibuprofen/tylenol for specific dosage instructions. -Make sure to have your covid test done tonight at work.   ED Prescriptions     Medication Sig Dispense Auth. Provider   lidocaine (XYLOCAINE) 2 % solution Use as directed 15 mLs in the mouth or throat as needed for mouth pain. 100 mL , PA-C     PDMP not reviewed this encounter.   , PA-C 08/30/20 1321

## 2020-09-01 LAB — CULTURE, GROUP A STREP (THRC)

## 2020-09-16 ENCOUNTER — Other Ambulatory Visit: Payer: Self-pay

## 2020-09-16 ENCOUNTER — Encounter: Payer: Self-pay | Admitting: Adult Health

## 2020-09-16 ENCOUNTER — Ambulatory Visit (INDEPENDENT_AMBULATORY_CARE_PROVIDER_SITE_OTHER): Payer: 59 | Admitting: Adult Health

## 2020-09-16 DIAGNOSIS — F331 Major depressive disorder, recurrent, moderate: Secondary | ICD-10-CM

## 2020-09-16 DIAGNOSIS — F909 Attention-deficit hyperactivity disorder, unspecified type: Secondary | ICD-10-CM

## 2020-09-16 DIAGNOSIS — F411 Generalized anxiety disorder: Secondary | ICD-10-CM

## 2020-09-16 DIAGNOSIS — F410 Panic disorder [episodic paroxysmal anxiety] without agoraphobia: Secondary | ICD-10-CM | POA: Diagnosis not present

## 2020-09-16 MED ORDER — AMPHETAMINE-DEXTROAMPHETAMINE 20 MG PO TABS: 20.0000 mg | ORAL_TABLET | Freq: Every day | ORAL | 0 refills | Status: DC

## 2020-09-16 MED ORDER — SERTRALINE HCL 50 MG PO TABS: 50.0000 mg | ORAL_TABLET | Freq: Every day | ORAL | 3 refills | Status: DC

## 2020-09-16 NOTE — Progress Notes (Signed)
Sharon Ruiz [id number] [birth year] 20 y.o.  Subjective:   Patient ID:  Sharon Ruiz is a 20 y.o. (DOB April 12, 2001) female.  Chief Complaint: No chief complaint on file.   HPI DEBIE ASHLINE presents to the office today for follow-up of ADHD, MDD, GAD, and panic attacks.   Describes mood today as "ok". Pleasant. Denies tearfulness. Mood symptoms - denies depression, anxiety and irritability. Denies panic attacks. Stating "I feel good". Feels like addition of Zoloft has been helpful and would like to continue at the 50mg  dose. Improved interest and motivation. Taking medications as prescribed.  Energy levels stable. Active, has a regular exercise routine.  Enjoys some usual interests and activities. Lives alone. Family local. Spending time with family. Appetite adequate - eating one meal a day. Weight stable - 205 pounds. Sleeps well most nights. Averages 6 hours. Works 3rd shift at .  Focus and concentration difficulties. Completing tasks. Managing aspects of household. Works full-time. Denies SI or HI.  Denies AH or VH. Seeing therapist.   Previous medication trials: Denies   PHQ2-9   Flowsheet Row Office Visit from 01/16/2020 in [facility] Office Visit from 09/27/2019 in [facility]  PHQ-2 Total Score 2 0  PHQ-9 Total Score 11 --    Flowsheet Row ED from 08/30/2020 in [facility] RISK CATEGORY Error: Question 6 not populated       Review of Systems:  Review of Systems  Musculoskeletal: Negative for gait problem.  Neurological: Negative for tremors.  Psychiatric/Behavioral:       Please refer to HPI    Medications: I have reviewed the patient's current medications.  Current Outpatient Medications  Medication Sig Dispense Refill  . amphetamine-dextroamphetamine (ADDERALL) 20 MG tablet Take 1 tablet (20 mg total) by mouth daily. 30 tablet 0  . Cholecalciferol 1.25 MG (50000 UT) TABS Take 5,000 Int'l  Units by mouth daily.    . ferrous sulfate 324 (65 Fe) MG TBEC Take 1 tablet by mouth daily.    [facility] lidocaine (XYLOCAINE) 2 % solution Use as directed 15 mLs in the mouth or throat as needed for mouth pain. 100 mL 0  . sertraline (ZOLOFT) 50 MG tablet Take 1 tablet (50 mg total) by mouth daily. 90 tablet 3   No current facility-administered medications for this visit.    Medication Side Effects: None  Allergies:  Allergies  Allergen Reactions  . Iodinated Diagnostic Agents Other (See Comments)    SHELL FISH; Mouth blisters and stomach pain Mouth blisters and stomach pain     No past medical history on file.  Family History  Problem Relation Age of Onset  . Hypertension Mother   . Hyperlipidemia Father     Social History   Socioeconomic History  . Marital status: Single    Spouse name: Not on file  . Number of children: Not on file  . Years of education: Not on file  . Highest education level: Not on file  Occupational History  . Occupation: [employer]  Tobacco Use  . Smoking status: Never Smoker  . Smokeless tobacco: Never Used  Substance and Sexual Activity  . Alcohol use: Never  . Drug use: Never  . Sexual activity: Not Currently  Other Topics Concern  . 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   Social Determinants of Health   Financial Resource Strain: Not on file  Food Insecurity: Not on  file  Transportation Needs: Not on file  Physical Activity: Not on file  Stress: Not on file  Social Connections: Not on file  Intimate Partner Violence: Not on file    Past Medical History, Surgical history, Social history, and Family history were reviewed and updated as appropriate.   Please see review of systems for further details on the patient's review from today.   Objective:   Physical Exam:  LMP 08/27/2020   Physical Exam Constitutional:      General: She is not in acute distress. Musculoskeletal:         General: No deformity.  Neurological:     Mental Status: She is alert and oriented to person, place, and time.     Coordination: Coordination normal.  Psychiatric:        Attention and Perception: Attention and perception normal. She does not perceive auditory or visual hallucinations.        Mood and Affect: Mood normal. Mood is not anxious or depressed. Affect is not labile, blunt, angry or inappropriate.        Speech: Speech normal.        Behavior: Behavior normal.        Thought Content: Thought content normal. Thought content is not paranoid or delusional. Thought content does not include homicidal or suicidal ideation. Thought content does not include homicidal or suicidal plan.        Cognition and Memory: Cognition and memory normal.        Judgment: Judgment normal.     Comments: Insight intact     Lab Review:     Component Value Date/Time   NA 136 04/22/2020 0822   K 4.1 04/22/2020 0822   CL 103 04/22/2020 0822   CO2 24 04/22/2020 0822   GLUCOSE 82 04/22/2020 0822   BUN 13 04/22/2020 0822   CREATININE 0.71 04/22/2020 0822   CALCIUM 9.3 04/22/2020 0822   PROT 7.3 04/22/2020 0822   AST 13 04/22/2020 0822   ALT 12 04/22/2020 0822   BILITOT 0.3 04/22/2020 0822   GFRNONAA 122 04/22/2020 0822   GFRAA 141 04/22/2020 0822       Component Value Date/Time   WBC 8.0 04/22/2020 0822   RBC 4.13 04/22/2020 0822   HGB 12.3 04/22/2020 0822   HCT 38.0 04/22/2020 0822   PLT 267 04/22/2020 0822   MCV 92.0 04/22/2020 0822   MCH 29.8 04/22/2020 0822   MCHC 32.4 04/22/2020 0822   RDW 12.9 04/22/2020 0822    No results found for: POCLITH, LITHIUM   No results found for: PHENYTOIN, PHENOBARB, VALPROATE, CBMZ   .res Assessment: Plan:    Plan:  PDMP reviewed  1. Zoloft 50mg  daily. 2. Add Adderall 20mg  - 1/2 twice daily  104/59 72  [facility] ADHD resting 43/58 - ADHD likely.  RTC 4 weeks  Patient advised to contact office with any questions, adverse effects,  or acute worsening in signs and symptoms.  Diagnoses and all orders for this visit:  Panic attacks  Attention deficit hyperactivity disorder (ADHD), unspecified ADHD type -     amphetamine-dextroamphetamine (ADDERALL) 20 MG tablet; Take 1 tablet (20 mg total) by mouth daily.  Major depressive disorder, recurrent episode, moderate (HCC) -     sertraline (ZOLOFT) 50 MG tablet; Take 1 tablet (50 mg total) by mouth daily.  Generalized anxiety disorder -     sertraline (ZOLOFT) 50 MG tablet; Take 1 tablet (50 mg total) by mouth daily.  Please see After Visit Summary for patient specific instructions.  Future Appointments  Date Time Provider Department Center  10/21/2020  8:00 AM Wilson Singer, MD [facility] None    No orders of the defined types were placed in this encounter.   -------------------------------

## 2020-10-14 ENCOUNTER — Ambulatory Visit: Payer: 59 | Admitting: Adult Health

## 2020-10-21 ENCOUNTER — Ambulatory Visit (INDEPENDENT_AMBULATORY_CARE_PROVIDER_SITE_OTHER): Payer: Managed Care, Other (non HMO) | Admitting: Internal Medicine

## 2020-10-30 ENCOUNTER — Other Ambulatory Visit: Payer: Self-pay | Admitting: Adult Health

## 2020-10-30 DIAGNOSIS — F411 Generalized anxiety disorder: Secondary | ICD-10-CM

## 2020-10-30 DIAGNOSIS — F331 Major depressive disorder, recurrent, moderate: Secondary | ICD-10-CM

## 2021-01-06 ENCOUNTER — Ambulatory Visit
Admission: RE | Admit: 2021-01-06 | Discharge: 2021-01-06 | Disposition: A | Discharge status: Home / Self Care | Payer: 59 | Source: Ambulatory Visit | Attending: Emergency Medicine | Admitting: Emergency Medicine

## 2021-01-06 ENCOUNTER — Other Ambulatory Visit: Payer: Self-pay

## 2021-01-06 VITALS — BP 109/68 | HR 82 | Temp 98.7°F | Resp 16

## 2021-01-06 DIAGNOSIS — B349 Viral infection, unspecified: Secondary | ICD-10-CM | POA: Diagnosis not present

## 2021-01-06 DIAGNOSIS — J029 Acute pharyngitis, unspecified: Secondary | ICD-10-CM | POA: Insufficient documentation

## 2021-01-06 LAB — POCT RAPID STREP A (OFFICE): Rapid Strep A Screen: NEGATIVE

## 2021-01-06 LAB — POCT MONO SCREEN (KUC): Mono, POC: NEGATIVE

## 2021-01-06 NOTE — ED Triage Notes (Signed)
Pt presents today with c/o sore throat, fever and headache x 2 days. She reports room mate has Mono and she would like to be tested.

## 2021-01-06 NOTE — ED Provider Notes (Signed)
Renaldo Fiddler    CSN: [id number] Arrival date & time: 01/06/21  1241      History   Chief Complaint Chief Complaint  Patient presents with   Sore Throat   Headache   Fever     HPI Sharon Ruiz is a 20 y.o. female.  Patient presents with 2-day history of fever, headache, sore throat.  Treatment at home with Tylenol; last taken at 0800.  She reports her roommate has mono and she would like to be tested.  She denies rash, cough, shortness of breath, vomiting, diarrhea, or other symptoms.  Her medical history includes deafness, vertigo, eczema, anemia, depression.  The history is provided by the patient and medical records.   History reviewed. No pertinent past medical history.  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      Home Medications    Prior to Admission medications   Medication Sig Start Date End Date Taking? Authorizing Provider  amphetamine-dextroamphetamine (ADDERALL) 20 MG tablet Take 1 tablet (20 mg total) by mouth daily. 09/16/20   Mozingo, Thereasa Solo, NP  Cholecalciferol 1.25 MG (50000 UT) TABS Take 5,000 Int'l Units by mouth daily.    [provider]  ferrous sulfate 324 (65 Fe) MG TBEC Take 1 tablet by mouth daily.    [provider]  lidocaine (XYLOCAINE) 2 % solution Use as directed 15 mLs in the mouth or throat as needed for mouth pain. 08/30/20   Rhys Martini, PA-C  sertraline (ZOLOFT) 50 MG tablet Take 1 tablet (50 mg  total) by mouth daily. 09/16/20   Mozingo, Thereasa Solo, NP    Family History Family History  Problem Relation Age of Onset   Hypertension Mother    Hyperlipidemia Father     Social History Social History   Tobacco Use   Smoking status: Never   Smokeless tobacco: Never  Substance Use Topics   Alcohol use: Never   Drug use: Never     Allergies   Iodinated diagnostic agents   Review of Systems Review of Systems  Constitutional:  Positive for fever. Negative for chills.  HENT:  Positive for sore throat. Negative for ear pain.   Respiratory:  Negative for cough and shortness of breath.   Cardiovascular:  Negative for chest pain and palpitations.  Gastrointestinal:  Negative for abdominal pain, diarrhea and vomiting.  Skin:  Negative for color change and rash.  Neurological:  Positive for headaches.  All other systems reviewed and are negative.   Physical Exam Triage Vital Signs ED Triage Vitals  Enc Vitals Group     BP      Pulse      Resp      Temp      Temp src      SpO2      Weight      Height      Head Circumference      Peak Flow  Pain Score      Pain Loc      Pain Edu?      Excl. in GC?    No data found.  Updated Vital Signs BP 109/68 (BP Location: Right Arm)   Pulse 82   Temp 98.7 F (37.1 C) (Oral)   Resp 16   LMP 12/23/2020 (Exact Date)   SpO2 97%   Visual Acuity Right Eye Distance:   Left Eye Distance:   Bilateral Distance:    Right Eye Near:   Left Eye Near:    Bilateral Near:     Physical Exam Vitals and nursing note reviewed.  Constitutional:      General: She is not in acute distress.    Appearance: She is well-developed. She is not ill-appearing.  HENT:     Head: Normocephalic and atraumatic.     Right Ear: Tympanic membrane normal.     Left Ear: Tympanic membrane normal.     Nose: Nose normal.     Mouth/Throat:     Mouth: Mucous membranes are moist.     Pharynx: Posterior oropharyngeal erythema present.  Eyes:      Conjunctiva/sclera: Conjunctivae normal.  Cardiovascular:     Rate and Rhythm: Normal rate and regular rhythm.     Heart sounds: Normal heart sounds.  Pulmonary:     Effort: Pulmonary effort is normal. No respiratory distress.     Breath sounds: Normal breath sounds.  Abdominal:     Palpations: Abdomen is soft.     Tenderness: There is no abdominal tenderness.  Musculoskeletal:     Cervical back: Neck supple.  Skin:    General: Skin is warm and dry.  Neurological:     General: No focal deficit present.     Mental Status: She is alert and oriented to person, place, and time.     Gait: Gait normal.  Psychiatric:        Mood and Affect: Mood normal.        Behavior: Behavior normal.     UC Treatments / Results  Labs (all labs ordered are listed, but only abnormal results are displayed) Labs Reviewed  CULTURE, GROUP A STREP [facility]  POCT RAPID STREP A (OFFICE)  POCT MONO SCREEN ([facility])    EKG   Radiology No results found.  Procedures Procedures (including critical care time)  Medications Ordered in UC Medications - No data to display  Initial Impression / Assessment and Plan / UC Course  I have reviewed the triage vital signs and the nursing notes.  Pertinent labs & imaging results that were available during my care of the patient were reviewed by me and considered in my medical decision making (see chart for details).   Sore throat, Viral illness.  Mono negative.  Rapid strep negative; culture pending.  Patient declines COVID or flu test today.  Discussed symptomatic treatment with Tylenol or ibuprofen, rest, hydration.  Instructed patient to follow-up with her PCP if her symptoms are not improving.  She agrees to plan of care.   Final Clinical Impressions(s) / UC Diagnoses   Final diagnoses:  Sore throat  Viral illness     Discharge Instructions      Your mono test is negative.    Your rapid strep test is negative.  A throat culture is pending; we  will call you if it is positive requiring treatment.    Take Tylenol or ibuprofen as needed for fever or discomfort.    Follow up with  your primary care provider if your symptoms are not improving.         ED Prescriptions   None    PDMP not reviewed this encounter.   Mickie Bail, NP 01/06/21 1316

## 2021-01-06 NOTE — Discharge Instructions (Addendum)
Your mono test is negative.    Your rapid strep test is negative.  A throat culture is pending; we will call you if it is positive requiring treatment.    Take Tylenol or ibuprofen as needed for fever or discomfort.    Follow up with your primary care provider if your symptoms are not improving.

## 2021-01-08 LAB — CULTURE, GROUP A STREP (THRC)

## 2021-05-22 DIAGNOSIS — Z23 Encounter for immunization: Secondary | ICD-10-CM | POA: Diagnosis not present

## 2021-07-06 ENCOUNTER — Other Ambulatory Visit: Payer: Self-pay

## 2021-07-06 DIAGNOSIS — F1520 Other stimulant dependence, uncomplicated: Secondary | ICD-10-CM | POA: Diagnosis not present

## 2021-07-06 DIAGNOSIS — Z5181 Encounter for therapeutic drug level monitoring: Secondary | ICD-10-CM | POA: Diagnosis not present

## 2021-07-06 DIAGNOSIS — Z79899 Other long term (current) drug therapy: Secondary | ICD-10-CM | POA: Diagnosis not present

## 2021-07-06 DIAGNOSIS — F902 Attention-deficit hyperactivity disorder, combined type: Secondary | ICD-10-CM | POA: Diagnosis not present

## 2021-07-06 MED ORDER — AMPHETAMINE-DEXTROAMPHET ER 20 MG PO CP24
ORAL_CAPSULE | ORAL | 0 refills | Status: DC
  Filled 2021-07-06: qty 30, 30d supply, fill #0

## 2021-07-21 ENCOUNTER — Other Ambulatory Visit: Payer: Self-pay

## 2021-07-21 DIAGNOSIS — F902 Attention-deficit hyperactivity disorder, combined type: Secondary | ICD-10-CM | POA: Diagnosis not present

## 2021-07-21 MED ORDER — BUPROPION HCL ER (XL) 150 MG PO TB24
ORAL_TABLET | ORAL | 0 refills | Status: DC
  Filled 2021-07-21: qty 30, 30d supply, fill #0

## 2021-08-04 ENCOUNTER — Other Ambulatory Visit: Payer: Self-pay

## 2023-09-15 ENCOUNTER — Encounter: Payer: Self-pay | Admitting: Gastroenterology

## 2023-09-21 ENCOUNTER — Ambulatory Visit: Payer: Commercial Managed Care - PPO | Admitting: Gastroenterology

## 2023-09-21 ENCOUNTER — Encounter: Payer: Self-pay | Admitting: Gastroenterology

## 2023-09-21 VITALS — BP 104/64 | HR 86 | Ht 65.0 in | Wt 218.0 lb

## 2023-09-21 DIAGNOSIS — K920 Hematemesis: Secondary | ICD-10-CM

## 2023-09-21 DIAGNOSIS — R112 Nausea with vomiting, unspecified: Secondary | ICD-10-CM | POA: Diagnosis not present

## 2023-09-21 DIAGNOSIS — F1290 Cannabis use, unspecified, uncomplicated: Secondary | ICD-10-CM

## 2023-09-21 DIAGNOSIS — R1013 Epigastric pain: Secondary | ICD-10-CM

## 2023-09-21 DIAGNOSIS — K219 Gastro-esophageal reflux disease without esophagitis: Secondary | ICD-10-CM

## 2023-09-21 DIAGNOSIS — R197 Diarrhea, unspecified: Secondary | ICD-10-CM

## 2023-09-21 MED ORDER — ONDANSETRON 4 MG PO TBDP: 4.0000 mg | ORAL_TABLET | Freq: Three times a day (TID) | ORAL | 1 refills | Status: AC | PRN

## 2023-09-21 MED ORDER — OMEPRAZOLE 20 MG PO CPDR: 20.0000 mg | DELAYED_RELEASE_CAPSULE | Freq: Two times a day (BID) | ORAL | 1 refills | Status: AC

## 2023-09-21 MED ORDER — COLESTIPOL HCL 1 G PO TABS: 1.0000 g | ORAL_TABLET | Freq: Two times a day (BID) | ORAL | 2 refills | Status: AC

## 2023-09-21 NOTE — Progress Notes (Signed)
 HPI :  23 year old female with a history of gallstones status post cholecystectomy, insomnia, abdominal pain, referred here by Jiles Prows, NP for multiple upper tract symptoms.  She reports her symptoms started about a year ago.  Symptoms started with occasional nausea and vomiting after eating certain foods.  She identified that spicy foods, red sauces, chocolate, caffeine, greasy foods were hard for her to tolerate and could reliably cause some symptoms.  Over time it has progressed and now she is having more frequent nausea and vomiting, as well as upper abdominal pain.  This can often happen after she eats but sometimes it can happen sporadically when she is not eating foods.  She states no any food can lead to her nausea and vomiting.  She can often feel nauseated first thing in the morning when she wakes, can have sporadic vomiting throughout the week.  About 3 months ago she had some blood in the vomit but has not seen that since then.  No blood in her stools.  In addition to this she has had some epigastric pain that can also go into her left upper quadrant.  This usually bothers her when she is feeling nauseated and vomiting but states these can also come and go independently as well but often postprandial.  She denies any NSAID use.  She had her gallbladder removed in 2018 and states that resolved some abdominal pain she was having at the time.  It sounds like she had an EGD as part of the workup which was normal.  No reports of that available.  She endorses chronic loose stools that often bother her after eating since she has had her gallbladder out.  She took Imodium for this which has made her constipated so she does not take much for it.  She rather have loose stools than constipation.  In addition to this she has been having some reflux that been bothering her.  She was placed on a trial of omeprazole in recent weeks and states that has helped her reflux and some of her upper tract symptoms  but they still largely persist.  Her primary care started her on Pepcid once in the morning as needed, she states she is not sure how much that helped yet.  She denies any family history of colon cancer, gastric cancer.  She does not drink any alcohol routinely, only socially use.  She used to smoke cigarettes but stopped a year ago.  She does smoke marijuana every night and has been doing so for the past 2 to 3 years.  She does this to treat insomnia.  She states that taking a hot shower can improve her symptoms but does not resolve them.  She otherwise denies any cardiopulmonary symptoms.   Prior workup on file: CT abdomen / pelvis 06/07/2017: IMPRESSION:   1. Single prominent gallstone at the neck, with possible findings that may suggest early or developing acute cholecystitis.  Consider hepatobiliary scan for further assessment if there is a high clinical suspicion.   2.  No bowel obstruction or acute inflammation.  Normal appendix.    RUQ [place] 06/08/2017: IMPRESSION:   1.  Cholelithiasis.  Nonspecific minimally thickened gallbladder wall and trace amount of pericholecystic fluid. The gallbladder is not distended. There is strong clinical suspicion of acute cholecystitis recommend nuclear medicine hepatobiliary scan.   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   Qualifier: Diagnosis of   By: Clent Ridges MD, Tera Mater     IMO SNOMED Dx Update Oct 2024     Dysmenorrhea    Insomnia    Sensorineural hearing loss (SNHL), bilateral 06/05/2019   VERTIGO 12/24/2008   Qualifier: Diagnosis of   By: Clent Ridges MD, Tera Mater        Vitamin D deficiency 01/16/2020     Past Surgical History:  Procedure Laterality Date   CHOLECYSTECTOMY  2018   WISDOM TOOTH EXTRACTION     Family History  Problem Relation Age of Onset   Hypertension Mother    Hearing  loss Mother    Hyperlipidemia Father    Colon cancer Neg Hx    Esophageal cancer Neg Hx    Social History   Tobacco Use   Smoking status: Former    Types: Cigarettes   Smokeless tobacco: Never  Vaping Use   Vaping status: Never Used  Substance Use Topics   Alcohol use: Yes    Comment: social   Drug use: Yes    Types: Marijuana    Comment: daily to go to sleep   Current Outpatient Medications  Medication Sig Dispense Refill   famotidine (PEPCID) 20 MG tablet Take 20 mg by mouth daily.     MICROGESTIN 1.5-30 MG-MCG tablet Take 1 tablet by mouth daily.     omeprazole (PRILOSEC) 20 MG capsule Take 20 mg by mouth daily.     No current facility-administered medications for this visit.   Allergies  Allergen Reactions   Iodinated Contrast Media Other (See Comments)    SHELL FISH; Mouth blisters and stomach pain Mouth blisters and stomach pain      Review of Systems: All systems reviewed and negative except where noted in HPI.   No recent labs on file - last done in 06/2022 - reviewed in Epic  Physical Exam: BP 104/64   Pulse 86   Ht 5\' 5"  (1.651 m)   Wt 218 lb (98.9 kg)   BMI 36.28 kg/m  Constitutional: Pleasant,well-developed, female in no acute distress. HEENT: Normocephalic and atraumatic. Conjunctivae are normal. No scleral icterus. Neck supple.  Cardiovascular: Normal rate, regular rhythm.  Pulmonary/chest: Effort normal and breath sounds normal. No wheezing, rales or rhonchi. Abdominal: Soft, nondistended, mild epigastric to LUQ TTP. There are no masses palpable. No hepatomegaly. Extremities: no edema Lymphadenopathy: No cervical adenopathy noted. Neurological: Alert and oriented to person place and time. Skin: Skin is warm and dry. No rashes noted. Psychiatric: Normal mood and affect. Behavior is normal.   ASSESSMENT: 23 y.o. female here for assessment of the following  1. Nausea and vomiting, unspecified vomiting type   2. Hematemesis with nausea    3. Abdominal pain, epigastric   4. Gastroesophageal reflux disease, unspecified whether esophagitis present   5. Marijuana use   6. Diarrhea, unspecified type    Constellation of symptoms ongoing for the past year with progressive worsening -often postprandial.  Nausea, vomiting, epigastric pain, reflux.  She has had 1 episode of hematemesis in recent months.  This is occurring in the setting of daily marijuana use.  We discussed potential role of marijuana being related to this - cannabinoid hyperemesis syndrome, gave her a handout about this and recommend complete cessation of marijuana while we are sorting this out.  Low-dose omeprazole has helped somewhat but symptoms largely persist.  Given her history of hematemesis and persistent symptoms over time I offered her  an upper endoscopy.  I discussed EGD, risks of the procedure and anesthesia and she wants to proceed.  In the interim recommend increasing omeprazole to twice daily dosing, take half hour prior to meal.  She can hold Pepcid while doing this.  I did also like to start her on some Zofran 4 mg ODT every 8 hours as needed, she can start taking this in the morning as that is when her nausea is worst in otherwise use as needed.  Otherwise suspect she has postcholecystectomy related loose stools, we discussed what this is.  I offered to try her on some colestipol to take once or twice daily and see if this helps.  She wished to try this after discussion.  Imodium has caused constipation in the past and she did not like that.  PLAN: - stop marijuana - handouts given on cannabinoid hyperemesis syndrome - increase omeprazole to 20mg  BID - can hold pepcid for now - start Zofran 4mg  ODT every 8 hours PRN - start colestid 1gm BID - 1 month trial - schedule EGD at the [facility]  Further recommendations pending her course.  Harlin Rain, MD [facility]  CC: Elenore Paddy, NP

## 2023-09-21 NOTE — Patient Instructions (Signed)
 You have been scheduled for an endoscopy. Please follow written instructions given to you at your visit today.  If you use inhalers (even only as needed), please bring them with you on the day of your procedure.  If you take any of the following medications, they will need to be adjusted prior to your procedure:   DO NOT TAKE 7 DAYS PRIOR TO TEST- Trulicity (dulaglutide) Ozempic, Wegovy (semaglutide) Mounjaro (tirzepatide) Bydureon Bcise (exanatide extended release)  DO NOT TAKE 1 DAY PRIOR TO YOUR TEST Rybelsus (semaglutide) Adlyxin (lixisenatide) Victoza (liraglutide) Byetta (exanatide) ___________________________________________________________________________  We have sent the following medications to your pharmacy for you to pick up at your convenience: Omeprazole: Take TWICE a day (Hold Pepcid) Zofran 4 mg ODT: dissolve 1 tablet orally every 8 hours as needed  Colestid 1 g: Take twice a day  Thank you for entrusting me with your care and for choosing [facility], Dr. Ileene Patrick    If your blood pressure at your visit was 140/90 or greater, please contact your primary care physician to follow up on this. ______________________________________________________  If you are age 25 or older, your body mass index should be between 23-30. Your Body mass index is 36.28 kg/m. If this is out of the aforementioned range listed, please consider follow up with your Primary Care Provider.  If you are age 63 or younger, your body mass index should be between 19-25. Your Body mass index is 36.28 kg/m. If this is out of the aformentioned range listed, please consider follow up with your Primary Care Provider.  ________________________________________________________  The [facility] providers would like to encourage you to use [facility] to communicate with providers for non-urgent requests or questions.  Due to long hold times on the telephone, sending your provider a message  by [facility] may be a faster and more efficient way to get a response.  Please allow 48 business hours for a response.  Please remember that this is for non-urgent requests.  _______________________________________________________  Due to recent changes in healthcare laws, you may see the results of your imaging and laboratory studies on MyChart before your provider has had a chance to review them.  We understand that in some cases there may be results that are confusing or concerning to you. Not all laboratory results come back in the same time frame and the provider may be waiting for multiple results in order to interpret others.  Please give [place] 48 hours in order for your provider to thoroughly review all the results before contacting the office for clarification of your results.

## 2023-10-04 ENCOUNTER — Encounter: Payer: Self-pay | Admitting: Gastroenterology

## 2023-10-11 ENCOUNTER — Ambulatory Visit (AMBULATORY_SURGERY_CENTER): Payer: BC Managed Care – PPO | Admitting: Gastroenterology

## 2023-10-11 ENCOUNTER — Encounter: Payer: Self-pay | Admitting: Gastroenterology

## 2023-10-11 VITALS — BP 107/71 | HR 61 | Temp 98.1°F | Resp 12 | Ht 65.0 in | Wt 218.0 lb

## 2023-10-11 DIAGNOSIS — K317 Polyp of stomach and duodenum: Secondary | ICD-10-CM | POA: Diagnosis not present

## 2023-10-11 DIAGNOSIS — R1013 Epigastric pain: Secondary | ICD-10-CM

## 2023-10-11 DIAGNOSIS — K3189 Other diseases of stomach and duodenum: Secondary | ICD-10-CM

## 2023-10-11 DIAGNOSIS — R112 Nausea with vomiting, unspecified: Secondary | ICD-10-CM

## 2023-10-11 DIAGNOSIS — K920 Hematemesis: Secondary | ICD-10-CM

## 2023-10-11 DIAGNOSIS — K219 Gastro-esophageal reflux disease without esophagitis: Secondary | ICD-10-CM | POA: Diagnosis not present

## 2023-10-11 DIAGNOSIS — K319 Disease of stomach and duodenum, unspecified: Secondary | ICD-10-CM | POA: Diagnosis not present

## 2023-10-11 MED ORDER — SODIUM CHLORIDE 0.9 % IV SOLN: 500.0000 mL | Freq: Once | INTRAVENOUS | Status: DC

## 2023-10-11 NOTE — Op Note (Signed)
 [facility] Patient Name: Sharon Ruiz Procedure Date: 10/11/2023 11:22 AM MRN: [id number] Endoscopist: Viviann Spare P. Adela Lank , MD, [id number] Age: 23 Referring MD:  Date of Birth: Jun 29, 2001 Gender: Female Account #: [id number] Procedure:                Upper GI endoscopy Indications:              Epigastric abdominal pain, Follow-up of                            gastro-esophageal reflux disease, Hematemesis,                            Nausea with vomiting, diarrhea - s/p                            cholecystectomy, history of marijuana use. on                            omeprazole twice daily, pepcid, Zofran - improved                            but symptoms persist to some extent Medicines:                Monitored Anesthesia Care Procedure:                Pre-Anesthesia Assessment:                           - Prior to the procedure, a History and Physical                            was performed, and patient medications and                            allergies were reviewed. The patient's tolerance of                            previous anesthesia was also reviewed. The risks                            and benefits of the procedure and the sedation                            options and risks were discussed with the patient.                            All questions were answered, and informed consent                            was obtained. Prior Anticoagulants: The patient has                            taken no anticoagulant or antiplatelet agents. ASA  Grade Assessment: II - A patient with mild systemic                            disease. After reviewing the risks and benefits,                            the patient was deemed in satisfactory condition to                            undergo the procedure.                           After obtaining informed consent, the endoscope was                            passed under direct vision.  Throughout the                            procedure, the patient's blood pressure, pulse, and                            oxygen saturations were monitored continuously. The                            GIF [id number] #[id number] was introduced through the                            mouth, and advanced to the second part of duodenum.                            The upper GI endoscopy was accomplished without                            difficulty. The patient tolerated the procedure                            well. Scope In: Scope Out: Findings:                 Esophagogastric landmarks were identified: the                            Z-line was found at 38 cm, the gastroesophageal                            junction was found at 38 cm and the upper extent of                            the gastric folds was found at 38 cm from the                            incisors.                           The exam of  the esophagus was otherwise normal.                           Multiple small sessile polyps were found in the                            gastric fundus and in the gastric body. Likely                            benign fundic gland polyps. Representative biopsies                            were taken with a cold forceps for histology.                           The exam of the stomach was otherwise normal.                           Biopsies were taken with a cold forceps for                            Helicobacter pylori testing.                           The examined duodenum was normal. Biopsies for                            histology were taken with a cold forceps for                            evaluation of celiac disease. Complications:            No immediate complications. Estimated blood loss:                            Minimal. Estimated Blood Loss:     Estimated blood loss was minimal. Impression:               - Esophagogastric landmarks identified.                           - Normal  esophagus otherwise - no erosive changes.                           - Multiple gastric polyps. Suspect benign fundic                            gland polyps. Biopsied.                           - Normal stomach otherwise - biopsied to rule out H                            pylori                           -  Normal examined duodenum. Biopsied. Recommendation:           - Patient has a contact number available for                            emergencies. The signs and symptoms of potential                            delayed complications were discussed with the                            patient. Return to normal activities tomorrow.                            Written discharge instructions were provided to the                            patient.                           - Resume previous diet.                           - Continue present medications.                           - Await pathology results.                           - Complete marijuana abstinence if at all possible.                           - Consideration for CT imaging and / or GES if                            symptoms persist Mable Dara P. Bennett Vanscyoc, MD 10/11/2023 11:41:05 AM This report has been signed electronically.

## 2023-10-11 NOTE — Patient Instructions (Signed)
 Please read handouts provided. Continue present medications. Await pathology results. Try to complete marijuana abstinence. Resume previous diet.   YOU HAD AN ENDOSCOPIC PROCEDURE TODAY AT THE [facility]:   Refer to the procedure report that was given to you for any specific questions about what was found during the examination.  If the procedure report does not answer your questions, please call your gastroenterologist to clarify.  If you requested that your care partner not be given the details of your procedure findings, then the procedure report has been included in a sealed envelope for you to review at your convenience later.  YOU SHOULD EXPECT: Some feelings of bloating in the abdomen. Passage of more gas than usual.  Walking can help get rid of the air that was put into your GI tract during the procedure and reduce the bloating. If you had a lower endoscopy (such as a colonoscopy or flexible sigmoidoscopy) you may notice spotting of blood in your stool or on the toilet paper. If you underwent a bowel prep for your procedure, you may not have a normal bowel movement for a few days.  Please Note:  You might notice some irritation and congestion in your nose or some drainage.  This is from the oxygen used during your procedure.  There is no need for concern and it should clear up in a day or so.  SYMPTOMS TO REPORT IMMEDIATELY:   Following upper endoscopy (EGD)  Vomiting of blood or coffee ground material  New chest pain or pain under the shoulder blades  Painful or persistently difficult swallowing  New shortness of breath  Fever of 100F or higher  Black, tarry-looking stools  For urgent or emergent issues, a gastroenterologist can be reached at any hour by calling [phone]. Do not use MyChart messaging for urgent concerns.    DIET:  We do recommend a small meal at first, but then you may proceed to your regular diet.  Drink plenty of fluids but you should avoid  alcoholic beverages for 24 hours.  ACTIVITY:  You should plan to take it easy for the rest of today and you should NOT DRIVE or use heavy machinery until tomorrow (because of the sedation medicines used during the test).    FOLLOW UP: Our staff will call the number listed on your records the next business day following your procedure.  We will call around 7:15- 8:00 am to check on you and address any questions or concerns that you may have regarding the information given to you following your procedure. If we do not reach you, we will leave a message.     If any biopsies were taken you will be contacted by phone or by letter within the next 1-3 weeks.  Please call us at [phone] if you have not heard about the biopsies in 3 weeks.    SIGNATURES/CONFIDENTIALITY: You and/or your care partner have signed paperwork which will be entered into your electronic medical record.  These signatures attest to the fact that that the information above on your After Visit Summary has been reviewed and is understood.  Full responsibility of the confidentiality of this discharge information lies with you and/or your care-partner.

## 2023-10-11 NOTE — Progress Notes (Signed)
 Called to room to assist during endoscopic procedure.  Patient ID and intended procedure confirmed with present staff. Received instructions for my participation in the procedure from the performing physician.

## 2023-10-11 NOTE — Progress Notes (Signed)
 Sedate, gd SR, tolerated procedure well, VSS, report to RN

## 2023-10-11 NOTE — Progress Notes (Signed)
 Pt's states no medical or surgical changes since previsit or office visit.

## 2023-10-11 NOTE — Progress Notes (Signed)
 History and Physical Interval Note: seen on 09/21/23 - increased omeprazole to BID, started Zofran. Recommended she stop marijuana. EGD to further evaluate symptoms as outlined. She has continued to smoke marijuana periodically. Symptoms continue to bother her, not as severe as previously.   10/11/2023 11:20 AM  Sharon Ruiz  has presented today for endoscopic procedure(s), with the diagnosis of  Encounter Diagnoses  Name Primary?   Nausea and vomiting, unspecified vomiting type Yes   Hematemesis with nausea    Abdominal pain, epigastric    Gastroesophageal reflux disease, unspecified whether esophagitis present   .  The various methods of evaluation and treatment have been discussed with the patient and/or family. After consideration of risks, benefits and other options for treatment, the patient has consented to  the endoscopic procedure(s).   The patient's history has been reviewed, patient examined, no change in status, stable for surgery.  I have reviewed the patient's chart and labs.  Questions were answered to the patient's satisfaction.    Harlin Rain, MD [facility]

## 2023-10-12 ENCOUNTER — Telehealth: Payer: Self-pay

## 2023-10-12 NOTE — Telephone Encounter (Signed)
  Follow up Call-     10/11/2023   10:49 AM  Call back number  Post procedure Call Back phone  # [phone]  Permission to leave phone message Yes     Patient questions:  Do you have a fever, pain , or abdominal swelling? No. Pain Score  0 *  Have you tolerated food without any problems? Yes.    Have you been able to return to your normal activities? Yes.    Do you have any questions about your discharge instructions: Diet   No. Medications  No. Follow up visit  No.  Do you have questions or concerns about your Care? No.  Actions: * If pain score is 4 or above: No action needed, pain <4.

## 2023-10-13 LAB — SURGICAL PATHOLOGY

## 2023-10-14 ENCOUNTER — Encounter: Payer: Self-pay | Admitting: Gastroenterology

## 2024-05-16 ENCOUNTER — Other Ambulatory Visit: Payer: Self-pay

## 2024-05-16 ENCOUNTER — Emergency Department
Admission: EM | Admit: 2024-05-16 | Discharge: 2024-05-16 | Disposition: A | Discharge status: Home / Self Care | Attending: Emergency Medicine | Admitting: Emergency Medicine

## 2024-05-16 DIAGNOSIS — R55 Syncope and collapse: Secondary | ICD-10-CM | POA: Diagnosis present

## 2024-05-16 LAB — COMPREHENSIVE METABOLIC PANEL WITH GFR
ALT: 13 U/L (ref 0–44)
AST: 17 U/L (ref 15–41)
Albumin: 3.3 g/dL — ABNORMAL LOW (ref 3.5–5.0)
Alkaline Phosphatase: 29 U/L — ABNORMAL LOW (ref 38–126)
Anion gap: 10 (ref 5–15)
BUN: 11 mg/dL (ref 6–20)
CO2: 23 mmol/L (ref 22–32)
Calcium: 8.4 mg/dL — ABNORMAL LOW (ref 8.9–10.3)
Chloride: 106 mmol/L (ref 98–111)
Creatinine, Ser: 0.74 mg/dL (ref 0.44–1.00)
GFR, Estimated: >60 mL/min (ref >60)
Glucose, Bld: 106 mg/dL — ABNORMAL HIGH (ref 70–99)
Potassium: 4.1 mmol/L (ref 3.5–5.1)
Sodium: 139 mmol/L (ref 135–145)
Total Bilirubin: 0.4 mg/dL (ref 0.0–1.2)
Total Protein: 6.8 g/dL (ref 6.5–8.1)

## 2024-05-16 LAB — CBC
HCT: 41.2 % (ref 36.0–46.0)
Hemoglobin: 13.9 g/dL (ref 12.0–15.0)
MCH: 30.8 pg (ref 26.0–34.0)
MCHC: 33.7 g/dL (ref 30.0–36.0)
MCV: 91.2 fL (ref 80.0–100.0)
Platelets: 237 K/uL (ref 150–400)
RBC: 4.52 MIL/uL (ref 3.87–5.11)
RDW: 13.6 % (ref 11.5–15.5)
WBC: 14.1 K/uL — ABNORMAL HIGH (ref 4.0–10.5)
nRBC: 0 % (ref 0.0–0.2)

## 2024-05-16 LAB — URINALYSIS, ROUTINE W REFLEX MICROSCOPIC
Bacteria, UA: NONE SEEN
Bilirubin Urine: NEGATIVE
Glucose, UA: NEGATIVE mg/dL
Ketones, ur: NEGATIVE mg/dL
Leukocytes,Ua: NEGATIVE
Nitrite: NEGATIVE
Protein, ur: NEGATIVE mg/dL
RBC / HPF: >50 RBC/hpf (ref 0–5)
Specific Gravity, Urine: 1.021 (ref 1.005–1.030)
pH: 5 (ref 5.0–8.0)

## 2024-05-16 LAB — CBG MONITORING, ED: Glucose-Capillary: 94 mg/dL (ref 70–99)

## 2024-05-16 MED ORDER — KETOROLAC TROMETHAMINE 30 MG/ML IJ SOLN
15.0000 mg | Freq: Once | INTRAMUSCULAR | Status: AC
  Administered 2024-05-16: 15 mg via INTRAVENOUS
  Filled 2024-05-16: qty 1

## 2024-05-16 MED ORDER — SODIUM CHLORIDE 0.9 % IV BOLUS
1000.0000 mL | Freq: Once | INTRAVENOUS | Status: AC
  Administered 2024-05-16: 1000 mL via INTRAVENOUS

## 2024-05-16 NOTE — ED Triage Notes (Signed)
 [facility] via [facility].  First donation, patient c/o N/VD after donation.  101/56 100% 62 98.1 105 CBG  20g RAC, 500 LR PTA. 4 mg Zofran  PTA.

## 2024-05-16 NOTE — ED Provider Notes (Signed)
 [facility] Provider Note    Event Date/Time   First MD Initiated Contact with Patient 05/16/24 1433     (approximate)   History   Near Syncope   HPI  Sharon Ruiz is a 23 y.o. female with a history of GERD and hematemesis who presents with an episode of syncope while donating plasma.  The patient states that she started to feel lightheaded and weak.  She then became nauseous.  She briefly lost consciousness.  Subsequently she developed vomiting and diarrhea.  She still feels somewhat nauseous although it is improved with Zofran  given by EMS.  She reports generalized body aches and weakness.  She denies any chest pain or palpitations.  She has no shortness of breath.   I reviewed the past medical records.  The patient's most recent outpatient encounter was with gastroenterology on 3/18 for an endoscopy.     Physical Exam   Triage Vital Signs: ED Triage Vitals  Encounter Vitals Group     BP 05/16/24 1357 112/77     Girls Systolic BP Percentile --      Girls Diastolic BP Percentile --      Boys Systolic BP Percentile --      Boys Diastolic BP Percentile --      Pulse Rate 05/16/24 1357 77     Resp 05/16/24 1357 18     Temp 05/16/24 1357 98.3 F (36.8 C)     Temp Source 05/16/24 1357 Oral     SpO2 05/16/24 1357 100 %     Weight 05/16/24 1354 228 lb (103.4 kg)     Height 05/16/24 1354 5' 5 (1.651 m)     Head Circumference --      Peak Flow --      Pain Score 05/16/24 1353 9     Pain Loc --      Pain Education --      Exclude from Growth Chart --     Most recent vital signs: Vitals:   05/16/24 1357  BP: 112/77  Pulse: 77  Resp: 18  Temp: 98.3 F (36.8 C)  SpO2: 100%     General: Awake, no distress.  CV:  Good peripheral perfusion.  Resp:  Normal effort.  Abd:  Soft and nontender.  No distention.  Other:  EOMI.  PERRLA.  No facial droop.  Normal speech.  Motor intact in all extremities.   ED Results / Procedures / Treatments    Labs (all labs ordered are listed, but only abnormal results are displayed) Labs Reviewed  COMPREHENSIVE METABOLIC PANEL WITH GFR - Abnormal; Notable for the following components:      Result Value   Glucose, Bld 106 (*)    Calcium 8.4 (*)    Albumin 3.3 (*)    Alkaline Phosphatase 29 (*)    All other components within normal limits  CBC - Abnormal; Notable for the following components:   WBC 14.1 (*)    All other components within normal limits  URINALYSIS, ROUTINE W REFLEX MICROSCOPIC - Abnormal; Notable for the following components:   Color, Urine YELLOW (*)    APPearance CLEAR (*)    Hgb urine dipstick MODERATE (*)    All other components within normal limits  POC URINE PREG, ED  CBG MONITORING, ED     EKG  ED ECG REPORT I, Waylon Cassis, the attending physician, personally viewed and interpreted this ECG.  Date: 05/16/2024 EKG Time: 1515 Rate: 64 Rhythm: normal sinus  rhythm QRS Axis: normal Intervals: normal ST/T Wave abnormalities: normal Narrative Interpretation: no evidence of acute ischemia    RADIOLOGY   PROCEDURES:  Critical Care performed: No  Procedures   MEDICATIONS ORDERED IN ED: Medications  sodium chloride  0.9 % bolus 1,000 mL (1,000 mLs Intravenous New Bag/Given 05/16/24 1513)  ketorolac (TORADOL) 30 MG/ML injection 15 mg (15 mg Intravenous Given 05/16/24 1510)     IMPRESSION / MDM / ASSESSMENT AND PLAN / ED COURSE  I reviewed the triage vital signs and the nursing notes.  23 year old female with PMH as noted above presents with a syncopal episode and nausea, vomiting, diarrhea after giving plasma.  She had a vasovagal type prodrome.  On exam her vital signs are normal.  Physical exam is unremarkable for other acute findings.  Differential diagnosis includes, but is not limited to, vasovagal syncope, dehydration/hypovolemia, anemia, viral syndrome given that she is reporting diffuse bodyaches.  CMP and CBC show no acute findings.   We will give a fluid bolus and Toradol and reassess.  Patient's presentation is most consistent with acute complicated illness / injury requiring diagnostic workup.  ----------------------------------------- 4:33 PM on 05/16/2024 -----------------------------------------  EKG is unremarkable.  The patient is feeling much better after the fluids and Toradol.  She feels ready to go home.  She is stable for discharge at this time.  I gave strict return precautions, and she expressed understanding.   FINAL CLINICAL IMPRESSION(S) / ED DIAGNOSES   Final diagnoses:  Vasovagal syncope     Rx / DC Orders   ED Discharge Orders     None        Note:  This document was prepared using Dragon voice recognition software and may include unintentional dictation errors.    Jacolyn Pae, MD 05/16/24 [phone]

## 2024-05-16 NOTE — ED Triage Notes (Signed)
 Pt arrives via [facility] from [facility] after passing out after giving blood about an hour ago. Pt reports n/v/d since giving blood. Pt reports not being able to sit still and feeling uncomfortable. Pt is A&Ox4 and in a recliner in triage.

## 2024-05-17 LAB — POC URINE PREG, ED: Preg Test, Ur: NEGATIVE
